# Patient Record
Sex: Male | Born: 1935 | Race: White | Hispanic: No | Smoking: Never smoker
Health system: Southern US, Community
[De-identification: ages and names within clinical notes are randomized; demographics above are authoritative.]

## PROBLEM LIST (undated history)

## (undated) DIAGNOSIS — I519 Heart disease, unspecified: Secondary | ICD-10-CM

## (undated) DIAGNOSIS — N289 Disorder of kidney and ureter, unspecified: Secondary | ICD-10-CM

## (undated) DIAGNOSIS — K219 Gastro-esophageal reflux disease without esophagitis: Secondary | ICD-10-CM

## (undated) DIAGNOSIS — I10 Essential (primary) hypertension: Secondary | ICD-10-CM

## (undated) HISTORY — PX: CORONARY ARTERY BYPASS GRAFT: SHX141

## (undated) HISTORY — PX: CHOLECYSTECTOMY: SHX55

---

## 2012-01-18 ENCOUNTER — Emergency Department (HOSPITAL_BASED_OUTPATIENT_CLINIC_OR_DEPARTMENT_OTHER)
Admission: EM | Admit: 2012-01-18 | Discharge: 2012-01-18 | Disposition: A | Discharge status: Home / Self Care | Payer: Medicare Other | Attending: Emergency Medicine | Admitting: Emergency Medicine

## 2012-01-18 ENCOUNTER — Encounter (HOSPITAL_BASED_OUTPATIENT_CLINIC_OR_DEPARTMENT_OTHER): Payer: Self-pay | Admitting: *Deleted

## 2012-01-18 DIAGNOSIS — N289 Disorder of kidney and ureter, unspecified: Secondary | ICD-10-CM | POA: Insufficient documentation

## 2012-01-18 DIAGNOSIS — I10 Essential (primary) hypertension: Secondary | ICD-10-CM | POA: Insufficient documentation

## 2012-01-18 DIAGNOSIS — T839XXA Unspecified complication of genitourinary prosthetic device, implant and graft, initial encounter: Secondary | ICD-10-CM

## 2012-01-18 DIAGNOSIS — T8389XA Other specified complication of genitourinary prosthetic devices, implants and grafts, initial encounter: Secondary | ICD-10-CM | POA: Insufficient documentation

## 2012-01-18 DIAGNOSIS — K219 Gastro-esophageal reflux disease without esophagitis: Secondary | ICD-10-CM | POA: Insufficient documentation

## 2012-01-18 DIAGNOSIS — N390 Urinary tract infection, site not specified: Secondary | ICD-10-CM

## 2012-01-18 DIAGNOSIS — Z951 Presence of aortocoronary bypass graft: Secondary | ICD-10-CM | POA: Insufficient documentation

## 2012-01-18 HISTORY — DX: Gastro-esophageal reflux disease without esophagitis: K21.9

## 2012-01-18 HISTORY — DX: Disorder of kidney and ureter, unspecified: N28.9

## 2012-01-18 HISTORY — DX: Heart disease, unspecified: I51.9

## 2012-01-18 HISTORY — DX: Essential (primary) hypertension: I10

## 2012-01-18 LAB — URINALYSIS, ROUTINE W REFLEX MICROSCOPIC
Bilirubin Urine: NEGATIVE
Glucose, UA: NEGATIVE mg/dL
Nitrite: POSITIVE — AB
Specific Gravity, Urine: 1.017 (ref 1.005–1.030)
pH: 6.5 (ref 5.0–8.0)

## 2012-01-18 LAB — URINE MICROSCOPIC-ADD ON

## 2012-01-18 MED ORDER — SULFAMETHOXAZOLE-TRIMETHOPRIM 800-160 MG PO TABS: 1.0000 | ORAL_TABLET | Freq: Two times a day (BID) | ORAL | Status: AC

## 2012-01-18 NOTE — ED Notes (Signed)
States his foley catheter is leaking. Foley was inserted 3 months ago for urinary retention.

## 2012-01-18 NOTE — ED Provider Notes (Signed)
History     CSN: [id number]  Arrival date & time 01/18/12  1744   First MD Initiated Contact with Patient 01/18/12 1918      No chief complaint on file.   (Consider location/radiation/quality/duration/timing/severity/associated sxs/prior treatment) Patient is a 78 y.o. male presenting with frequency. The history is provided by the patient. No language interpreter was used.  Urinary Frequency This is a new problem.  Pt complains of urine leaking from around foley.  Pt reports he started having leaking today  Past Medical History  Diagnosis Date  . Hypertension   . GERD (gastroesophageal reflux disease)   . Heart disease   . Renal disorder     Past Surgical History  Procedure Date  . Cholecystectomy   . Coronary artery bypass graft     No family history on file.  History  Substance Use Topics  . Smoking status: Never Smoker   . Smokeless tobacco: Not on file  . Alcohol Use: No      Review of Systems  Genitourinary: Positive for frequency.  All other systems reviewed and are negative.    Allergies  Review of patient's allergies indicates no known allergies.  Home Medications   Current Outpatient Rx  Name Route Sig Dispense Refill  . ASPIRIN 81 MG PO CHEW Oral Chew 81 mg by mouth daily.    Marland Kitchen CARVEDILOL 6.25 MG PO TABS Oral Take 6.25 mg by mouth 2 (two) times daily with a meal.    . IRON PO Oral Take 1 tablet by mouth 2 (two) times daily.    Marland Kitchen SIMVASTATIN 10 MG PO TABS Oral Take 10 mg by mouth daily.    . ERGOCALCIFEROL 50000 UNITS PO CAPS Oral Take 50,000 Units by mouth once a week.      BP 116/67  Pulse 70  Temp 97.4 F (36.3 C) (Oral)  Resp 22  SpO2 98%  Physical Exam  Nursing note and vitals reviewed. Constitutional: He appears well-developed and well-nourished.  HENT:  Head: Normocephalic and atraumatic.  Eyes: Conjunctivae are normal. Pupils are equal, round, and reactive to light.  Neck: Normal range of motion.  Cardiovascular: Normal  rate.   Pulmonary/Chest: Effort normal and breath sounds normal.  Abdominal: Soft. Bowel sounds are normal.  Genitourinary:       Foley no urine in bag,  Musculoskeletal: Normal range of motion.  Neurological: He is alert.  Skin: Skin is warm.  Psychiatric: He has a normal mood and affect.    ED Course  Procedures (including critical care time)   Labs Reviewed  URINALYSIS, ROUTINE W REFLEX MICROSCOPIC   No results found.   No diagnosis found.    MDM  Foley replaced 500cc urine output.       Lonia Skinner [place], [place] 01/18/12 2052

## 2012-01-18 NOTE — ED Provider Notes (Signed)
History/physical exam/procedure(s) were performed by non-physician practitioner and as supervising physician I was immediately available for consultation/collaboration. I have reviewed all notes and am in agreement with care and plan.   Hilario Quarry, MD 01/18/12 [phone]

## 2012-01-26 ENCOUNTER — Encounter (HOSPITAL_BASED_OUTPATIENT_CLINIC_OR_DEPARTMENT_OTHER): Payer: Self-pay | Admitting: *Deleted

## 2012-01-26 ENCOUNTER — Emergency Department (HOSPITAL_BASED_OUTPATIENT_CLINIC_OR_DEPARTMENT_OTHER)
Admission: EM | Admit: 2012-01-26 | Discharge: 2012-01-26 | Disposition: A | Discharge status: Home / Self Care | Payer: Medicare Other | Attending: Emergency Medicine | Admitting: Emergency Medicine

## 2012-01-26 DIAGNOSIS — I10 Essential (primary) hypertension: Secondary | ICD-10-CM | POA: Insufficient documentation

## 2012-01-26 DIAGNOSIS — I2510 Atherosclerotic heart disease of native coronary artery without angina pectoris: Secondary | ICD-10-CM | POA: Insufficient documentation

## 2012-01-26 DIAGNOSIS — K219 Gastro-esophageal reflux disease without esophagitis: Secondary | ICD-10-CM | POA: Insufficient documentation

## 2012-01-26 DIAGNOSIS — Z951 Presence of aortocoronary bypass graft: Secondary | ICD-10-CM | POA: Insufficient documentation

## 2012-01-26 DIAGNOSIS — N289 Disorder of kidney and ureter, unspecified: Secondary | ICD-10-CM | POA: Insufficient documentation

## 2012-01-26 DIAGNOSIS — Z9010 Acquired absence of unspecified breast and nipple: Secondary | ICD-10-CM | POA: Insufficient documentation

## 2012-01-26 DIAGNOSIS — IMO0002 Reserved for concepts with insufficient information to code with codable children: Secondary | ICD-10-CM | POA: Insufficient documentation

## 2012-01-26 DIAGNOSIS — S0990XA Unspecified injury of head, initial encounter: Secondary | ICD-10-CM | POA: Insufficient documentation

## 2012-01-26 NOTE — ED Provider Notes (Signed)
History     CSN: [id number]  Arrival date & time 01/26/12  2137   First MD Initiated Contact with Patient 01/26/12 2241      Chief Complaint  Patient presents with  . Fall  . Head Injury    HPI Comments: Pt was walking and tripped on the carpet hitting his head on the door. The patient lives with his son and his son heard a loud thud when this occurred. He was concerned and decided to bring his father in to be examined. The patient did not lose consciousness. He does not have a headache. He is not taking Coumadin or Plavix. He denies any neck pain. He's been able to ambulate without difficulty. He denies any nausea vomiting fever or other complaints  Patient is a 78 y.o. male presenting with fall and head injury. The history is provided by the patient.  Fall The accident occurred less than 1 hour ago. The fall occurred while walking.  Head Injury    the patient struck the back of his head on the door and Past Medical History  Diagnosis Date  . Hypertension   . GERD (gastroesophageal reflux disease)   . Heart disease   . Renal disorder     Past Surgical History  Procedure Date  . Cholecystectomy   . Coronary artery bypass graft     History reviewed. No pertinent family history.  History  Substance Use Topics  . Smoking status: Never Smoker   . Smokeless tobacco: Not on file  . Alcohol Use: No      Review of Systems  All other systems reviewed and are negative.    Allergies  Review of patient's allergies indicates no known allergies.  Home Medications   Current Outpatient Rx  Name Route Sig Dispense Refill  . ASPIRIN 81 MG PO CHEW Oral Chew 81 mg by mouth daily.    Marland Kitchen CARVEDILOL 6.25 MG PO TABS Oral Take 6.25 mg by mouth 2 (two) times daily with a meal.    . ERGOCALCIFEROL 50000 UNITS PO CAPS Oral Take 50,000 Units by mouth once a week.    . IRON PO Oral Take 1 tablet by mouth 2 (two) times daily.    Marland Kitchen SIMVASTATIN 10 MG PO TABS Oral Take 10 mg by mouth  daily.    . SULFAMETHOXAZOLE-TRIMETHOPRIM 800-160 MG PO TABS Oral Take 1 tablet by mouth every 12 (twelve) hours. 14 tablet 0    BP 118/67  Pulse 65  Temp 97.6 F (36.4 C) (Oral)  Ht 5\' 6"  (1.676 m)  Wt 176 lb (79.833 kg)  BMI 28.41 kg/m2  SpO2 98%  Physical Exam  Nursing note and vitals reviewed. Constitutional: He appears well-developed and well-nourished. No distress.  HENT:  Head: Normocephalic and atraumatic.  Right Ear: External ear normal.  Left Ear: External ear normal.       No tenderness palpation, no hematoma  Eyes: Conjunctivae are normal. Right eye exhibits no discharge. Left eye exhibits no discharge. No scleral icterus.  Neck: Neck supple. No tracheal deviation present.  Cardiovascular: Normal rate, regular rhythm and intact distal pulses.   Pulmonary/Chest: Effort normal and breath sounds normal. No stridor. No respiratory distress. He has no wheezes. He has no rales.  Abdominal: Soft. Bowel sounds are normal. He exhibits no distension. There is no tenderness. There is no rebound and no guarding.  Musculoskeletal: He exhibits no edema and no tenderness.       No cervical spine, thoracic or lumbar spine  tenderness  Neurological: He is alert. He has normal strength. No sensory deficit. Cranial nerve deficit:  no gross defecits noted. He exhibits normal muscle tone. He displays no seizure activity. Coordination normal.  Skin: Skin is warm and dry. No rash noted.  Psychiatric: He has a normal mood and affect.    ED Course  Procedures (including critical care time)  Labs Reviewed - No data to display No results found.   1. Minor head injury       MDM  The patient had a minor head injury. He did not lose consciousness. He is not on any anticoagulants. There are no external findings on exam at this time. He did not feel that CT scan imaging is necessary. I discussed precautions with the patient and his son and they're comfortable with going home and they will  monitor for any signs of headache, confusion or worsening symptoms       Celene Kras, MD 01/26/12 [phone]

## 2012-01-26 NOTE — ED Notes (Signed)
Pt c/o fall x 1 hr ago landing on carpet hitting head on door.

## 2012-02-03 ENCOUNTER — Emergency Department (HOSPITAL_BASED_OUTPATIENT_CLINIC_OR_DEPARTMENT_OTHER)
Admission: EM | Admit: 2012-02-03 | Discharge: 2012-02-03 | Disposition: A | Discharge status: Home / Self Care | Payer: Medicare Other | Attending: Emergency Medicine | Admitting: Emergency Medicine

## 2012-02-03 ENCOUNTER — Emergency Department (HOSPITAL_BASED_OUTPATIENT_CLINIC_OR_DEPARTMENT_OTHER): Payer: Medicare Other

## 2012-02-03 ENCOUNTER — Encounter (HOSPITAL_BASED_OUTPATIENT_CLINIC_OR_DEPARTMENT_OTHER): Payer: Self-pay | Admitting: *Deleted

## 2012-02-03 DIAGNOSIS — I10 Essential (primary) hypertension: Secondary | ICD-10-CM | POA: Insufficient documentation

## 2012-02-03 DIAGNOSIS — K219 Gastro-esophageal reflux disease without esophagitis: Secondary | ICD-10-CM | POA: Insufficient documentation

## 2012-02-03 DIAGNOSIS — T18108A Unspecified foreign body in esophagus causing other injury, initial encounter: Secondary | ICD-10-CM

## 2012-02-03 DIAGNOSIS — I2510 Atherosclerotic heart disease of native coronary artery without angina pectoris: Secondary | ICD-10-CM | POA: Insufficient documentation

## 2012-02-03 DIAGNOSIS — Z951 Presence of aortocoronary bypass graft: Secondary | ICD-10-CM | POA: Insufficient documentation

## 2012-02-03 DIAGNOSIS — N289 Disorder of kidney and ureter, unspecified: Secondary | ICD-10-CM | POA: Insufficient documentation

## 2012-02-03 DIAGNOSIS — IMO0002 Reserved for concepts with insufficient information to code with codable children: Secondary | ICD-10-CM | POA: Insufficient documentation

## 2012-02-03 NOTE — ED Provider Notes (Signed)
History     CSN: [id number]  Arrival date & time 02/03/12  2841   First MD Initiated Contact with Patient 02/03/12 1920      Chief Complaint  Patient presents with  . Dysphagia    (Consider location/radiation/quality/duration/timing/severity/associated sxs/prior treatment) HPI Comments: Son states that the pt was eating fried okra, mashed potatoes and black eyed peas and started choking:pt was c/o feeling like something was stuck in his throat:pt states that he is feeling better at this this time:pt denies sob or problems tolerating saliva  The history is provided by the patient. No language interpreter was used.    Past Medical History  Diagnosis Date  . Hypertension   . GERD (gastroesophageal reflux disease)   . Heart disease   . Renal disorder     Past Surgical History  Procedure Date  . Cholecystectomy   . Coronary artery bypass graft     History reviewed. No pertinent family history.  History  Substance Use Topics  . Smoking status: Never Smoker   . Smokeless tobacco: Not on file  . Alcohol Use: No      Review of Systems  Respiratory: Negative.   Cardiovascular: Negative.   Neurological: Negative.     Allergies  Review of patient's allergies indicates no known allergies.  Home Medications   Current Outpatient Rx  Name Route Sig Dispense Refill  . ASPIRIN 81 MG PO CHEW Oral Chew 81 mg by mouth daily.    Marland Kitchen CARVEDILOL 6.25 MG PO TABS Oral Take 6.25 mg by mouth 2 (two) times daily with a meal.    . FUROSEMIDE 20 MG PO TABS Oral Take 20 mg by mouth 2 (two) times daily.    . IRON PO Oral Take 1 tablet by mouth 2 (two) times daily.    Marland Kitchen SIMVASTATIN 10 MG PO TABS Oral Take 10 mg by mouth daily.    . ERGOCALCIFEROL 50000 UNITS PO CAPS Oral Take 50,000 Units by mouth once a week.      BP 127/66  Pulse 68  Temp 98 F (36.7 C) (Oral)  Resp 20  Ht 5\' 6"  (1.676 m)  Wt 160 lb (72.576 kg)  BMI 25.82 kg/m2  SpO2 96%  Physical Exam  Nursing note and  vitals reviewed. Constitutional: He appears well-developed and well-nourished.  HENT:  Head: Normocephalic and atraumatic.  Mouth/Throat: Oropharynx is clear and moist.  Cardiovascular: Normal rate and regular rhythm.   Pulmonary/Chest: Effort normal and breath sounds normal.  Musculoskeletal: Normal range of motion.  Neurological: He is alert. He exhibits normal muscle tone. Coordination normal.  Skin: Skin is warm and dry.    ED Course  Procedures (including critical care time)  Labs Reviewed - No data to display Dg Chest 2 View  02/03/2012  *RADIOLOGY REPORT*  Clinical Data: Shortness of breath and trouble swallowing.  CHEST - 2 VIEW  Comparison: None  Findings: Cardiomegaly is identified with evidence of previous CABG. Small bilateral pleural effusions are noted with bibasilar atelectasis. There is no evidence of airspace disease, pulmonary edema or pneumothorax. No acute bony abnormalities are identified.  IMPRESSION: Cardiomegaly with small bilateral pleural effusions and bibasilar atelectasis.  Original Report Authenticated By: Rosendo Gros, M.D.     1. Foreign body in esophagus       MDM  Pt feeling better:pt is tolerating po without any problem        Teressa Lower, NP 02/03/12 2124

## 2012-02-03 NOTE — ED Notes (Signed)
Pt was eating and started having trouble swallowing. No distress at present. Spitting.

## 2012-02-03 NOTE — ED Provider Notes (Signed)
History/physical exam/procedure(s) were performed by non-physician practitioner and as supervising physician I was immediately available for consultation/collaboration. I have reviewed all notes and am in agreement with care and plan.   Hilario Quarry, MD 02/03/12 [phone]

## 2012-02-03 NOTE — ED Notes (Signed)
Patient transported to X-ray 

## 2012-02-03 NOTE — ED Notes (Signed)
Pt drinking juice per EDNP order- tolerating well

## 2012-02-24 ENCOUNTER — Encounter (HOSPITAL_BASED_OUTPATIENT_CLINIC_OR_DEPARTMENT_OTHER): Payer: Self-pay | Admitting: *Deleted

## 2012-02-24 ENCOUNTER — Emergency Department (HOSPITAL_BASED_OUTPATIENT_CLINIC_OR_DEPARTMENT_OTHER)
Admission: EM | Admit: 2012-02-24 | Discharge: 2012-02-24 | Disposition: A | Discharge status: Home / Self Care | Payer: Medicare Other | Attending: Emergency Medicine | Admitting: Emergency Medicine

## 2012-02-24 DIAGNOSIS — K219 Gastro-esophageal reflux disease without esophagitis: Secondary | ICD-10-CM | POA: Insufficient documentation

## 2012-02-24 DIAGNOSIS — N289 Disorder of kidney and ureter, unspecified: Secondary | ICD-10-CM | POA: Insufficient documentation

## 2012-02-24 DIAGNOSIS — Z9861 Coronary angioplasty status: Secondary | ICD-10-CM | POA: Insufficient documentation

## 2012-02-24 DIAGNOSIS — T83091A Other mechanical complication of indwelling urethral catheter, initial encounter: Secondary | ICD-10-CM | POA: Insufficient documentation

## 2012-02-24 DIAGNOSIS — T839XXA Unspecified complication of genitourinary prosthetic device, implant and graft, initial encounter: Secondary | ICD-10-CM

## 2012-02-24 DIAGNOSIS — Y846 Urinary catheterization as the cause of abnormal reaction of the patient, or of later complication, without mention of misadventure at the time of the procedure: Secondary | ICD-10-CM | POA: Insufficient documentation

## 2012-02-24 DIAGNOSIS — I519 Heart disease, unspecified: Secondary | ICD-10-CM | POA: Insufficient documentation

## 2012-02-24 DIAGNOSIS — I10 Essential (primary) hypertension: Secondary | ICD-10-CM | POA: Insufficient documentation

## 2012-02-24 DIAGNOSIS — Z79899 Other long term (current) drug therapy: Secondary | ICD-10-CM | POA: Insufficient documentation

## 2012-02-24 DIAGNOSIS — Z7982 Long term (current) use of aspirin: Secondary | ICD-10-CM | POA: Insufficient documentation

## 2012-02-24 NOTE — ED Provider Notes (Addendum)
History     CSN: [id number]  Arrival date & time 02/24/12  1258   First MD Initiated Contact with Patient 02/24/12 1308      Chief Complaint  Patient presents with  . Urinary Incontinence    (Consider location/radiation/quality/duration/timing/severity/associated sxs/prior treatment) HPI Comments: Patient is here due to the Foley catheter leaking this morning. He has had a chronic indwelling Foley that was replaced on 01/18/2012. Presents today do to leakage around it. He denies any abdominal pain, fever, confusion or nausea. States he saw the urologist yesterday and everything was normal.  The history is provided by the patient and a relative.    Past Medical History  Diagnosis Date  . Hypertension   . GERD (gastroesophageal reflux disease)   . Heart disease   . Renal disorder     Past Surgical History  Procedure Date  . Cholecystectomy   . Coronary artery bypass graft     History reviewed. No pertinent family history.  History  Substance Use Topics  . Smoking status: Never Smoker   . Smokeless tobacco: Not on file  . Alcohol Use: No      Review of Systems  Constitutional: Negative for fever.  Respiratory: Negative for cough and shortness of breath.   Gastrointestinal: Negative for nausea, vomiting and abdominal pain.  Genitourinary: Negative for dysuria and flank pain.  All other systems reviewed and are negative.    Allergies  Review of patient's allergies indicates no known allergies.  Home Medications   Current Outpatient Rx  Name Route Sig Dispense Refill  . ASPIRIN 81 MG PO CHEW Oral Chew 81 mg by mouth daily.    Marland Kitchen CARVEDILOL 6.25 MG PO TABS Oral Take 6.25 mg by mouth 2 (two) times daily with a meal.    . ERGOCALCIFEROL 50000 UNITS PO CAPS Oral Take 50,000 Units by mouth once a week.    . FUROSEMIDE 20 MG PO TABS Oral Take 20 mg by mouth 2 (two) times daily.    . IRON PO Oral Take 1 tablet by mouth 2 (two) times daily.    Marland Kitchen SIMVASTATIN 10 MG PO  TABS Oral Take 10 mg by mouth daily.      BP 122/64  Pulse 69  Temp 97.6 F (36.4 C) (Oral)  Resp 16  Ht 5\' 6"  (1.676 m)  Wt 165 lb (74.844 kg)  BMI 26.63 kg/m2  SpO2 99%  Physical Exam  Nursing note and vitals reviewed. Constitutional: He is oriented to person, place, and time. He appears well-developed and well-nourished. No distress.  HENT:  Head: Normocephalic and atraumatic.  Mouth/Throat: Oropharynx is clear and moist.  Eyes: EOM are normal. Pupils are equal, round, and reactive to light.  Abdominal: Soft. Bowel sounds are normal. He exhibits no distension. There is no tenderness.  Genitourinary:       Foley catheter presents with urine and the bag. Tried urine on the patient's clothing. No current leakage  Musculoskeletal: Normal range of motion. He exhibits no edema and no tenderness.  Neurological: He is alert and oriented to person, place, and time.  Skin: Skin is warm and dry.  Psychiatric: He has a normal mood and affect. His behavior is normal.    ED Course  Procedures (including critical care time)   Labs Reviewed  URINE CULTURE   No results found.   1. Foley catheter problem       MDM   Patient with a chronic Foley that was last changed almost 2 months  ago who presents do to the catheter leaking this morning. Patient denies any symptoms concerning for a UTI such as fever, confusion, abdominal pain. Will replace Foley catheter and send a urine culture. Patient sees the urologist regularly and will followup with them. Will return for any worsening symptoms.        Gwyneth Sprout, MD 02/24/12 1318  Gwyneth Sprout, MD 02/24/12 1319

## 2012-02-24 NOTE — ED Notes (Signed)
Pt has a foley catheter that started leaking this morning. Has been seen here for same.

## 2012-02-27 LAB — URINE CULTURE: Colony Count: 40000

## 2012-02-28 NOTE — ED Notes (Signed)
+   Urine Chart sent to EDP office for review. 

## 2012-03-28 ENCOUNTER — Emergency Department (HOSPITAL_BASED_OUTPATIENT_CLINIC_OR_DEPARTMENT_OTHER)
Admission: EM | Admit: 2012-03-28 | Discharge: 2012-03-28 | Disposition: A | Discharge status: Home / Self Care | Payer: Medicare Other | Attending: Emergency Medicine | Admitting: Emergency Medicine

## 2012-03-28 ENCOUNTER — Encounter (HOSPITAL_BASED_OUTPATIENT_CLINIC_OR_DEPARTMENT_OTHER): Payer: Self-pay | Admitting: *Deleted

## 2012-03-28 DIAGNOSIS — T83091A Other mechanical complication of indwelling urethral catheter, initial encounter: Secondary | ICD-10-CM | POA: Insufficient documentation

## 2012-03-28 DIAGNOSIS — K219 Gastro-esophageal reflux disease without esophagitis: Secondary | ICD-10-CM | POA: Insufficient documentation

## 2012-03-28 DIAGNOSIS — Z7982 Long term (current) use of aspirin: Secondary | ICD-10-CM | POA: Insufficient documentation

## 2012-03-28 DIAGNOSIS — I519 Heart disease, unspecified: Secondary | ICD-10-CM | POA: Insufficient documentation

## 2012-03-28 DIAGNOSIS — N289 Disorder of kidney and ureter, unspecified: Secondary | ICD-10-CM | POA: Insufficient documentation

## 2012-03-28 DIAGNOSIS — Y846 Urinary catheterization as the cause of abnormal reaction of the patient, or of later complication, without mention of misadventure at the time of the procedure: Secondary | ICD-10-CM | POA: Insufficient documentation

## 2012-03-28 DIAGNOSIS — I10 Essential (primary) hypertension: Secondary | ICD-10-CM | POA: Insufficient documentation

## 2012-03-28 DIAGNOSIS — T839XXA Unspecified complication of genitourinary prosthetic device, implant and graft, initial encounter: Secondary | ICD-10-CM

## 2012-03-28 DIAGNOSIS — N390 Urinary tract infection, site not specified: Secondary | ICD-10-CM | POA: Insufficient documentation

## 2012-03-28 DIAGNOSIS — Z79899 Other long term (current) drug therapy: Secondary | ICD-10-CM | POA: Insufficient documentation

## 2012-03-28 DIAGNOSIS — N138 Other obstructive and reflux uropathy: Secondary | ICD-10-CM | POA: Insufficient documentation

## 2012-03-28 DIAGNOSIS — R338 Other retention of urine: Secondary | ICD-10-CM | POA: Insufficient documentation

## 2012-03-28 DIAGNOSIS — N401 Enlarged prostate with lower urinary tract symptoms: Secondary | ICD-10-CM | POA: Insufficient documentation

## 2012-03-28 LAB — URINALYSIS, ROUTINE W REFLEX MICROSCOPIC
Nitrite: NEGATIVE
Specific Gravity, Urine: 1.012 (ref 1.005–1.030)
Urobilinogen, UA: 0.2 mg/dL (ref 0.0–1.0)

## 2012-03-28 LAB — URINE MICROSCOPIC-ADD ON

## 2012-03-28 MED ORDER — NITROFURANTOIN MONOHYD MACRO 100 MG PO CAPS: 100.0000 mg | ORAL_CAPSULE | Freq: Two times a day (BID) | ORAL | Status: DC

## 2012-03-28 NOTE — ED Notes (Signed)
Woke up this am noted his foley catheter had been leaking no other symptoms

## 2012-03-28 NOTE — ED Provider Notes (Signed)
History     CSN: [id number]  Arrival date & time 03/28/12  0709   First MD Initiated Contact with Patient 03/28/12 [phone]      Chief Complaint  Patient presents with  . foley cath leaking     (Consider location/radiation/quality/duration/timing/severity/associated sxs/prior treatment) HPIHarold Gallagher is a 78 y.o. male with a history of prostatic hyperplasia causing urinary outlet obstruction, who had a Foley placed a few months ago he has had it replaced a few times since then because it has been blocked and leaking. The patient woke up this morning and noticed that his sheets are wet and noted that the Foley catheter was also leaking. His son lives with him and empties the catheter leg bag about 2-3 times daily. Patient has been treated for urinary tract infection recently, denies any fevers, chills, lightheadedness, nausea vomiting or diarrhea.  Patient's son does note some mild confusion occasionally yesterday.    Past Medical History  Diagnosis Date  . Hypertension   . GERD (gastroesophageal reflux disease)   . Heart disease   . Renal disorder     Past Surgical History  Procedure Date  . Cholecystectomy   . Coronary artery bypass graft     History reviewed. No pertinent family history.  History  Substance Use Topics  . Smoking status: Never Smoker   . Smokeless tobacco: Not on file  . Alcohol Use: No      Review of Systems At least 10pt or greater review of systems completed and are negative except where specified in the HPI.  Allergies  Review of patient's allergies indicates no known allergies.  Home Medications   Current Outpatient Rx  Name Route Sig Dispense Refill  . ASPIRIN 81 MG PO CHEW Oral Chew 81 mg by mouth daily.    Marland Kitchen CARVEDILOL 6.25 MG PO TABS Oral Take 6.25 mg by mouth 2 (two) times daily with a meal.    . ERGOCALCIFEROL 50000 UNITS PO CAPS Oral Take 50,000 Units by mouth once a week.    . FUROSEMIDE 20 MG PO TABS Oral Take 20 mg by mouth 2  (two) times daily.    . IRON PO Oral Take 1 tablet by mouth 2 (two) times daily.    Marland Kitchen SIMVASTATIN 10 MG PO TABS Oral Take 10 mg by mouth daily.      BP 118/67  Pulse 64  Temp 97.8 F (36.6 C) (Oral)  Resp 16  Ht 5\' 6"  (1.676 m)  Wt 170 lb (77.111 kg)  BMI 27.44 kg/m2  SpO2 100%  Physical Exam  Nursing notes reviewed.  Electronic medical record reviewed. VITAL SIGNS:   Filed Vitals:   03/28/12 0727  BP: 118/67  Pulse: 64  Temp: 97.8 F (36.6 C)  TempSrc: Oral  Resp: 16  Height: 5\' 6"  (1.676 m)  Weight: 170 lb (77.111 kg)  SpO2: 100%   CONSTITUTIONAL: Awake, oriented, appears non-toxic HENT: Atraumatic, normocephalic, oral mucosa pink and moist, airway patent. Nares patent without drainage. External ears normal. EYES: Conjunctiva clear, EOMI, PERRLA NECK: Trachea midline, non-tender, supple CARDIOVASCULAR: Normal heart rate, Normal rhythm, No murmurs, rubs, gallops PULMONARY/CHEST: Clear to auscultation, no rhonchi, wheezes, or rales. Symmetrical breath sounds. Non-tender. ABDOMINAL: Mildly distended lower abdomen, mildly tender to palpation-this feels like a pressure, otherwise soft, non-tender - no rebound or guarding.  BS normal. NEUROLOGIC: Non-focal, moving all four extremities, no gross sensory or motor deficits. EXTREMITIES: No clubbing, cyanosis, or edema SKIN: Warm, Dry, No erythema, No rash  ED  Course  [place] bedside Performed by: Corey Gallagher Authorized by: Corey Gallagher Consent: Verbal consent obtained. Patient identity confirmed: verbally with patient Local anesthesia used: no Patient sedated: no Patient tolerance: Patient tolerated the procedure well with no immediate complications. Comments: Patient's lower abdomen was ultrasounded at the bedside showing an extremely dilated and distended bladder it extended up to the patient's inferior umbilicus. Urine bag at the bedside is empty    (including critical care time)  Labs Reviewed  URINALYSIS,  ROUTINE W REFLEX MICROSCOPIC - Abnormal; Notable for the following:    APPearance TURBID (*)     Hgb urine dipstick MODERATE (*)     Protein, ur 30 (*)     Leukocytes, UA LARGE (*)     All other components within normal limits  URINE MICROSCOPIC-ADD ON - Abnormal; Notable for the following:    Bacteria, UA FEW (*)     All other components within normal limits   No results found.   1. Foley catheter problem   2. Enlarged prostate with urinary retention   3. UTI (urinary tract infection)      Medications  nitrofurantoin, macrocrystal-monohydrate, (MACROBID) 100 MG capsule (not administered)     MDM  Corey Gallagher is a 78 y.o. male presenting with failed Foley catheter. Will replace Foley and obtain urine sample.   nurse replace Foley catheter and urinalysis shows turbid urine. Urinalysis is consistent with colonization-easy symptomatic with the exception of the mildly confused yesterday, this is concerning that the patient has become symptomatic because of his blocked Foley catheter. We'll elect to the patient with Macrobid as a review of his prior cultures had shown a coag negative staph sensitive to Macrobid in the past.  Patient has followup with urology and with his primary care physician.  Discussed returning to the emergency department with patient and his son. I explained the diagnosis and have given explicit precautions to return to the ER including fevers, chills, nausea, vomiting or worsening confusion or changes in mental status or any other new or worsening symptoms. The patient understands and accepts the medical plan as it's been dictated and I have answered their questions. Discharge instructions concerning home care and prescriptions have been given.  The patient is STABLE and is discharged to home in good condition.          Corey Skene, MD 03/30/12 1502

## 2012-03-28 NOTE — ED Notes (Signed)
#  16 foley catheter inserted by Michaelle Copas, RN per protocol. Immediate return of 400cc creamy, cloudy whitish colored urine. Connected to s/d, samples sent to lab for testing.

## 2012-03-28 NOTE — ED Notes (Signed)
Pt states that his catheter is leaking from the tube. No leakage at the moment. Denies pain or burning. States that it is emptied 2-3 times a day by son.

## 2012-03-30 LAB — URINE CULTURE: Colony Count: 75000

## 2012-03-31 NOTE — ED Notes (Signed)
+  Urine. Patient given Macrobid. No sensitivities listed. Chart sent to EDP office for review. °

## 2012-04-04 NOTE — ED Notes (Signed)
Rx for Fluconazole 150 mg po 1 x per [facility]

## 2012-04-05 ENCOUNTER — Telehealth (HOSPITAL_COMMUNITY): Payer: Self-pay | Admitting: *Deleted

## 2012-04-06 ENCOUNTER — Telehealth (HOSPITAL_COMMUNITY): Payer: Self-pay | Admitting: Emergency Medicine

## 2012-04-07 ENCOUNTER — Telehealth (HOSPITAL_COMMUNITY): Payer: Self-pay | Admitting: Emergency Medicine

## 2012-04-07 NOTE — ED Notes (Signed)
Called patient and patient requested we talk to Corey Gallagher. Spoke with Corey Gallagher and informed him of new Rx. Rx called to [facility] by Norm Parcel PFM.

## 2012-04-16 ENCOUNTER — Encounter (HOSPITAL_BASED_OUTPATIENT_CLINIC_OR_DEPARTMENT_OTHER): Payer: Self-pay | Admitting: *Deleted

## 2012-04-16 ENCOUNTER — Emergency Department (HOSPITAL_BASED_OUTPATIENT_CLINIC_OR_DEPARTMENT_OTHER)
Admission: EM | Admit: 2012-04-16 | Discharge: 2012-04-16 | Disposition: A | Discharge status: Home / Self Care | Payer: Medicare Other | Attending: Emergency Medicine | Admitting: Emergency Medicine

## 2012-04-16 DIAGNOSIS — R300 Dysuria: Secondary | ICD-10-CM | POA: Insufficient documentation

## 2012-04-16 DIAGNOSIS — Z951 Presence of aortocoronary bypass graft: Secondary | ICD-10-CM | POA: Insufficient documentation

## 2012-04-16 DIAGNOSIS — K219 Gastro-esophageal reflux disease without esophagitis: Secondary | ICD-10-CM | POA: Insufficient documentation

## 2012-04-16 DIAGNOSIS — I10 Essential (primary) hypertension: Secondary | ICD-10-CM | POA: Insufficient documentation

## 2012-04-16 DIAGNOSIS — Z87440 Personal history of urinary (tract) infections: Secondary | ICD-10-CM | POA: Insufficient documentation

## 2012-04-16 MED ORDER — CIPROFLOXACIN HCL 500 MG PO TABS: 500.0000 mg | ORAL_TABLET | Freq: Two times a day (BID) | ORAL | Status: AC

## 2012-04-16 MED ORDER — NITROFURANTOIN MONOHYD MACRO 100 MG PO CAPS: 100.0000 mg | ORAL_CAPSULE | Freq: Two times a day (BID) | ORAL | Status: AC

## 2012-04-16 NOTE — ED Provider Notes (Signed)
History     CSN: [id number]  Arrival date & time 04/16/12  2016   First MD Initiated Contact with Patient 04/16/12 2033      Chief Complaint  Patient presents with  . Abdominal Pain     HPI Patient has feeling of dysuria and urgency.  Has indwelling Foley first last several months.  History of UTIs in the past.  Denies fever.  Occasional abdominal pain. Past Medical History  Diagnosis Date  . Hypertension   . GERD (gastroesophageal reflux disease)   . Heart disease   . Renal disorder     Past Surgical History  Procedure Date  . Cholecystectomy   . Coronary artery bypass graft     No family history on file.  History  Substance Use Topics  . Smoking status: Never Smoker   . Smokeless tobacco: Not on file  . Alcohol Use: No      Review of Systems  All other systems reviewed and are negative.    Allergies  Review of patient's allergies indicates no known allergies.  Home Medications   Current Outpatient Rx  Name Route Sig Dispense Refill  . ASPIRIN 81 MG PO CHEW Oral Chew 81 mg by mouth daily.    Marland Kitchen CARVEDILOL 6.25 MG PO TABS Oral Take 6.25 mg by mouth 2 (two) times daily with a meal.    . ERGOCALCIFEROL 50000 UNITS PO CAPS Oral Take 50,000 Units by mouth once a week.    . FUROSEMIDE 20 MG PO TABS Oral Take 20 mg by mouth 2 (two) times daily.    . IRON PO Oral Take 1 tablet by mouth 2 (two) times daily.    Marland Kitchen NITROFURANTOIN MONOHYD MACRO 100 MG PO CAPS Oral Take 1 capsule (100 mg total) by mouth 2 (two) times daily. 10 capsule 0  . NITROFURANTOIN MONOHYD MACRO 100 MG PO CAPS Oral Take 1 capsule (100 mg total) by mouth 2 (two) times daily. 10 capsule 0  . SIMVASTATIN 10 MG PO TABS Oral Take 10 mg by mouth daily.      BP 115/66  Pulse 72  Temp 98.1 F (36.7 C) (Oral)  Resp 22  Ht 5\' 6"  (1.676 m)  Wt 165 lb (74.844 kg)  BMI 26.63 kg/m2  SpO2 99%  Physical Exam  Nursing note and vitals reviewed. Constitutional: He is oriented to person, place, and  time. He appears well-developed and well-nourished. No distress.  HENT:  Head: Normocephalic and atraumatic.  Eyes: Pupils are equal, round, and reactive to light.  Neck: Normal range of motion.  Cardiovascular: Normal rate and intact distal pulses.   Pulmonary/Chest: No respiratory distress.  Abdominal: Normal appearance. He exhibits no distension. There is no tenderness. There is no rebound.  Genitourinary:       Foley catheter flushed.  Evaluation with ultrasound showed bladder empty.  The balloon in place.  Musculoskeletal: Normal range of motion.  Neurological: He is alert and oriented to person, place, and time. No cranial nerve deficit.  Skin: Skin is warm and dry. No rash noted.  Psychiatric: He has a normal mood and affect. His behavior is normal.    ED Course  Procedures (including critical care time)  Labs Reviewed - No data to display No results found.   1. Dysuria       MDM          Nelia Shi, MD 04/16/12 2108

## 2012-04-16 NOTE — ED Notes (Signed)
Abdominal pain. Has had a urinary catheter since January. It was changed here a month ago.

## 2012-04-16 NOTE — ED Notes (Signed)
Foley cath irrigated without difficulty, clear yellow urine returned, pt tolerated well, new leg bag applied

## 2014-03-12 ENCOUNTER — Emergency Department (HOSPITAL_BASED_OUTPATIENT_CLINIC_OR_DEPARTMENT_OTHER)
Admission: EM | Admit: 2014-03-12 | Discharge: 2014-03-12 | Disposition: A | Discharge status: Home / Self Care | Payer: Medicare Other | Attending: Emergency Medicine | Admitting: Emergency Medicine

## 2014-03-12 ENCOUNTER — Encounter (HOSPITAL_BASED_OUTPATIENT_CLINIC_OR_DEPARTMENT_OTHER): Payer: Self-pay | Admitting: Emergency Medicine

## 2014-03-12 ENCOUNTER — Emergency Department (HOSPITAL_BASED_OUTPATIENT_CLINIC_OR_DEPARTMENT_OTHER): Payer: Medicare Other

## 2014-03-12 DIAGNOSIS — S2249XA Multiple fractures of ribs, unspecified side, initial encounter for closed fracture: Secondary | ICD-10-CM | POA: Insufficient documentation

## 2014-03-12 DIAGNOSIS — Z79899 Other long term (current) drug therapy: Secondary | ICD-10-CM | POA: Insufficient documentation

## 2014-03-12 DIAGNOSIS — I10 Essential (primary) hypertension: Secondary | ICD-10-CM | POA: Insufficient documentation

## 2014-03-12 DIAGNOSIS — Z951 Presence of aortocoronary bypass graft: Secondary | ICD-10-CM | POA: Diagnosis not present

## 2014-03-12 DIAGNOSIS — Z87448 Personal history of other diseases of urinary system: Secondary | ICD-10-CM | POA: Diagnosis not present

## 2014-03-12 DIAGNOSIS — Z792 Long term (current) use of antibiotics: Secondary | ICD-10-CM | POA: Insufficient documentation

## 2014-03-12 DIAGNOSIS — Y929 Unspecified place or not applicable: Secondary | ICD-10-CM | POA: Diagnosis not present

## 2014-03-12 DIAGNOSIS — S298XXA Other specified injuries of thorax, initial encounter: Secondary | ICD-10-CM | POA: Diagnosis present

## 2014-03-12 DIAGNOSIS — S2241XA Multiple fractures of ribs, right side, initial encounter for closed fracture: Secondary | ICD-10-CM

## 2014-03-12 DIAGNOSIS — Z7982 Long term (current) use of aspirin: Secondary | ICD-10-CM | POA: Diagnosis not present

## 2014-03-12 DIAGNOSIS — Y9389 Activity, other specified: Secondary | ICD-10-CM | POA: Insufficient documentation

## 2014-03-12 DIAGNOSIS — R296 Repeated falls: Secondary | ICD-10-CM | POA: Diagnosis not present

## 2014-03-12 DIAGNOSIS — Z8719 Personal history of other diseases of the digestive system: Secondary | ICD-10-CM | POA: Insufficient documentation

## 2014-03-12 MED ORDER — HYDROCODONE-ACETAMINOPHEN 5-325 MG PO TABS: 0.5000 | ORAL_TABLET | ORAL | Status: DC | PRN

## 2014-03-12 MED ORDER — HYDROCODONE-ACETAMINOPHEN 5-325 MG PO TABS
0.5000 | ORAL_TABLET | Freq: Once | ORAL | Status: AC
  Administered 2014-03-12: 0.5 via ORAL
  Filled 2014-03-12: qty 1

## 2014-03-12 NOTE — ED Notes (Signed)
Lost his balance and fell backward. Pain in his right ribs.

## 2014-03-12 NOTE — Discharge Instructions (Signed)

## 2014-03-12 NOTE — ED Provider Notes (Signed)
CSN: [id number]     Arrival date & time 03/12/14  2020 History   None    This chart was scribed for Corey Seamen, MD by Arlan Organ, ED Scribe. This patient was seen in room MH09/MH09 and the patient's care was started 11:49 PM.   Chief Complaint  Patient presents with  . Fall   Patient is a 80 y.o. male presenting with fall.  Fall    HPI Comments: Corey Gallagher is a 80 y.o. male, who normally ambulates with a walker, complaining of a fall that occurred around 3:30 PM this afternoon. Pt states he got turned around backwards and fell into a table. He now c/o pain to the lower back and R lower ribs. Pain is minimal with normal respirations, but exacerbated with coughing and sneezing which causes it to be transiently severe. He denies neck or spinal injury or associated symptoms. His ambulation and ability to stand and sit have not been compromised.  Past Medical History  Diagnosis Date  . Hypertension   . GERD (gastroesophageal reflux disease)   . Heart disease   . Renal disorder    Past Surgical History  Procedure Laterality Date  . Cholecystectomy    . Coronary artery bypass graft     No family history on file. History  Substance Use Topics  . Smoking status: Never Smoker   . Smokeless tobacco: Not on file  . Alcohol Use: No    Review of Systems  All other systems reviewed and are negative.  Allergies  Review of patient's allergies indicates no known allergies.  Home Medications   Prior to Admission medications   Medication Sig Start Date End Date Taking? Authorizing Provider  aspirin 81 MG chewable tablet Chew 81 mg by mouth daily.    Historical Provider, MD  carvedilol (COREG) 6.25 MG tablet Take 6.25 mg by mouth 2 (two) times daily with a meal.    Historical Provider, MD  ciprofloxacin (CIPRO) 500 MG tablet Take 1 tablet (500 mg total) by mouth every 12 (twelve) hours. 04/16/12   Nelia Shi, MD  ergocalciferol (VITAMIN D2) 50000 UNITS capsule Take 50,000  Units by mouth once a week.    Historical Provider, MD  furosemide (LASIX) 20 MG tablet Take 20 mg by mouth 2 (two) times daily.    Historical Provider, MD  HYDROcodone-acetaminophen (NORCO) 5-325 MG per tablet Take 0.5-1 tablets by mouth every 4 (four) hours as needed (for pain; may cause constipation). 03/12/14   Stephan Nelis L Mickelle Goupil, MD  IRON PO Take 1 tablet by mouth 2 (two) times daily.    Historical Provider, MD  nitrofurantoin, macrocrystal-monohydrate, (MACROBID) 100 MG capsule Take 1 capsule (100 mg total) by mouth 2 (two) times daily. 04/16/12   Nelia Shi, MD  simvastatin (ZOCOR) 10 MG tablet Take 10 mg by mouth daily.    Historical Provider, MD   Triage Vitals: BP 159/81  Pulse 71  Temp(Src) 98.2 F (36.8 C) (Oral)  Resp 20  Wt 165 lb (74.844 kg)  SpO2 100%   Physical Exam  General: Well-developed, well-nourished male in no acute distress; appearance consistent with age of record HENT: normocephalic; atraumatic Eyes: pupils equal, round and reactive to light; extraocular muscles intact Neck: supple; nontender Heart: regular rate and rhythm; no murmurs, rubs or gallops Lungs: clear to auscultation bilaterally Abdomen: soft; nondistended; nontender; no masses or hepatosplenomegaly; bowel sounds present Extremities: No deformity; full range of motion; pulses normal; +1 edema of lower legs Neurologic: Awake,  alert and oriented; motor function intact in all extremities and symmetric; no facial droop Skin: Warm and dry Psychiatric: Normal mood and affect  Back: tenderness to R lower back and inferior R posterior ribs without deformity or crepitus palpated; no spinal tenderness  ED Course  Procedures (including critical care time)  DIAGNOSTIC STUDIES: Oxygen Saturation is 100% on RA, Normal by my interpretation.    COORDINATION OF CARE: 11:49 PM- Will order Discussed treatment plan with pt at bedside and pt agreed to plan.    MDM  Nursing notes and vitals signs, including  pulse oximetry, reviewed.  Summary of this visit's results, reviewed by myself:  Imaging Studies: Dg Ribs Unilateral W/chest Right  03/12/2014   CLINICAL DATA:  Fall, right posterior rib pain  EXAM: RIGHT RIBS AND CHEST - 3+ VIEW  COMPARISON:  Chest radiographs dated 12/01/2013  FINDINGS: Chronic interstitial markings. No focal consolidation. No pleural effusion or pneumothorax.  Cardiomegaly.  Postsurgical changes related to prior CABG.  Mildly displaced right posterior 9th-11th rib fractures.  IMPRESSION: Mildly displaced right posterior 9th-11th rib fractures.   Electronically Signed   By: Charline Bills M.D.   On: 03/12/2014 21:24    Final diagnoses:  Fracture of ribs, three, closed, right, initial encounter   I personally performed the services described in this documentation, which was scribed in my presence. The recorded information has been reviewed and is accurate.    Corey Seamen, MD 03/12/14 2351

## 2014-10-02 HISTORY — PX: BLADDER SURGERY: SHX569

## 2014-12-07 ENCOUNTER — Emergency Department (HOSPITAL_BASED_OUTPATIENT_CLINIC_OR_DEPARTMENT_OTHER): Payer: Medicare Other

## 2014-12-07 ENCOUNTER — Encounter (HOSPITAL_BASED_OUTPATIENT_CLINIC_OR_DEPARTMENT_OTHER): Payer: Self-pay | Admitting: Emergency Medicine

## 2014-12-07 ENCOUNTER — Emergency Department (HOSPITAL_BASED_OUTPATIENT_CLINIC_OR_DEPARTMENT_OTHER)
Admission: EM | Admit: 2014-12-07 | Discharge: 2014-12-07 | Disposition: A | Discharge status: Home / Self Care | Payer: Medicare Other | Attending: Emergency Medicine | Admitting: Emergency Medicine

## 2014-12-07 DIAGNOSIS — Z792 Long term (current) use of antibiotics: Secondary | ICD-10-CM | POA: Diagnosis not present

## 2014-12-07 DIAGNOSIS — Z79899 Other long term (current) drug therapy: Secondary | ICD-10-CM | POA: Diagnosis not present

## 2014-12-07 DIAGNOSIS — Z7982 Long term (current) use of aspirin: Secondary | ICD-10-CM | POA: Diagnosis not present

## 2014-12-07 DIAGNOSIS — Y998 Other external cause status: Secondary | ICD-10-CM | POA: Insufficient documentation

## 2014-12-07 DIAGNOSIS — Y9389 Activity, other specified: Secondary | ICD-10-CM | POA: Insufficient documentation

## 2014-12-07 DIAGNOSIS — W182XXA Fall in (into) shower or empty bathtub, initial encounter: Secondary | ICD-10-CM | POA: Insufficient documentation

## 2014-12-07 DIAGNOSIS — R0781 Pleurodynia: Secondary | ICD-10-CM

## 2014-12-07 DIAGNOSIS — S40011A Contusion of right shoulder, initial encounter: Secondary | ICD-10-CM | POA: Insufficient documentation

## 2014-12-07 DIAGNOSIS — W19XXXA Unspecified fall, initial encounter: Secondary | ICD-10-CM

## 2014-12-07 DIAGNOSIS — I10 Essential (primary) hypertension: Secondary | ICD-10-CM | POA: Diagnosis not present

## 2014-12-07 DIAGNOSIS — Z8719 Personal history of other diseases of the digestive system: Secondary | ICD-10-CM | POA: Diagnosis not present

## 2014-12-07 DIAGNOSIS — Z87448 Personal history of other diseases of urinary system: Secondary | ICD-10-CM | POA: Diagnosis not present

## 2014-12-07 DIAGNOSIS — S299XXA Unspecified injury of thorax, initial encounter: Secondary | ICD-10-CM | POA: Insufficient documentation

## 2014-12-07 DIAGNOSIS — Y92002 Bathroom of unspecified non-institutional (private) residence single-family (private) house as the place of occurrence of the external cause: Secondary | ICD-10-CM | POA: Diagnosis not present

## 2014-12-07 DIAGNOSIS — S3991XA Unspecified injury of abdomen, initial encounter: Secondary | ICD-10-CM | POA: Insufficient documentation

## 2014-12-07 MED ORDER — HYDROCODONE-ACETAMINOPHEN 5-325 MG PO TABS
1.0000 | ORAL_TABLET | Freq: Once | ORAL | Status: AC
  Administered 2014-12-07: 1 via ORAL
  Filled 2014-12-07: qty 1

## 2014-12-07 MED ORDER — HYDROCODONE-ACETAMINOPHEN 5-325 MG PO TABS: 1.0000 | ORAL_TABLET | ORAL | Status: AC | PRN

## 2014-12-07 NOTE — ED Notes (Addendum)
81 yo c/oleft rib pain after fall in the bathroom tonight. Pt states "I feel while getting up from the toilet and landed against the bathtub and could not get up." Pain 5/10 Takes Daily Asprin

## 2014-12-07 NOTE — Discharge Instructions (Signed)
Return to the ED with any concerns including difficulty breathing, worsening pain not controlled by pain medications, vomiting, seizure activity, headache, decreased level of alertness/lethargy, or any other alarming symptoms  You should use your incentive spirometer 10 times every hour as previously directed

## 2014-12-07 NOTE — ED Provider Notes (Signed)
CSN: [id number]     Arrival date & time 12/07/14  2052 History   This chart was scribed for Jerelyn ScottMartha Linker, MD by Octavia HeirArianna Nassar, ED Scribe. This patient was seen in room MH01/MH01 and the patient's care was started at 10:13 PM.    Chief Complaint  Patient presents with  . Fall     The history is provided by the patient. No language interpreter was used.    HPI Comments: Corey Gallagher is a 81 y.o. male who presents to the Emergency Department complaining of a fall that occurred tonight. Pt has associated left sided flank pain and a bruise on his right shoulder blade. Pt notes he went to the bathroom and slipped and fell backwards in the bathtub. Pt denies LOC and head injury. Pt was able to pull himself up after the incident occurred. Pt has PMHx of broken ribs in the past. Pain is constant, worse with movement and palpation.  There are no other associated systemic symptoms, there are no other alleviating or modifying factors.  Pt has no known drug allergies.  Past Medical History  Diagnosis Date  . Hypertension   . GERD (gastroesophageal reflux disease)   . Heart disease   . Renal disorder    Past Surgical History  Procedure Laterality Date  . Cholecystectomy    . Coronary artery bypass graft    . Bladder surgery  10/2014   No family history on file. History  Substance Use Topics  . Smoking status: Never Smoker   . Smokeless tobacco: Not on file  . Alcohol Use: No    Review of Systems  Musculoskeletal: Positive for myalgias.  All other systems reviewed and are negative.     Allergies  Review of patient's allergies indicates no known allergies.  Home Medications   Prior to Admission medications   Medication Sig Start Date End Date Taking? Authorizing Provider  aspirin 81 MG chewable tablet Chew 81 mg by mouth daily.    Historical Provider, MD  carvedilol (COREG) 6.25 MG tablet Take 6.25 mg by mouth 2 (two) times daily with a meal.    Historical Provider, MD   ciprofloxacin (CIPRO) 500 MG tablet Take 1 tablet (500 mg total) by mouth every 12 (twelve) hours. 04/16/12   Nelva Nayobert Beaton, MD  ergocalciferol (VITAMIN D2) 50000 UNITS capsule Take 50,000 Units by mouth once a week.    Historical Provider, MD  furosemide (LASIX) 20 MG tablet Take 20 mg by mouth 2 (two) times daily.    Historical Provider, MD  HYDROcodone-acetaminophen (NORCO/VICODIN) 5-325 MG per tablet Take 1 tablet by mouth every 4 (four) hours as needed. 12/07/14   Jerelyn ScottMartha Linker, MD  IRON PO Take 1 tablet by mouth 2 (two) times daily.    Historical Provider, MD  nitrofurantoin, macrocrystal-monohydrate, (MACROBID) 100 MG capsule Take 1 capsule (100 mg total) by mouth 2 (two) times daily. 04/16/12   Nelva Nayobert Beaton, MD  simvastatin (ZOCOR) 10 MG tablet Take 10 mg by mouth daily.    Historical Provider, MD   Triage vitals: BP 129/79 mmHg  Pulse 68  Temp(Src) 98.2 F (36.8 C) (Oral)  Resp 18  Ht 5\' 4"  (1.626 m)  Wt 180 lb (81.647 kg)  BMI 30.88 kg/m2  SpO2 96% Vitals reviewed Physical Exam  Physical Examination: General appearance - alert, well appearing, and in no distress Mental status - alert, oriented to person, place, and time Head- NCAT Eyes - PERRL, EOMI Neck - supple, no significant adenopathy Chest - clear  to auscultation, no wheezes, rales or rhonchi, symmetric air entry, ttp over lateral left ribs at midaxillary line, no crepitus Heart - normal rate, regular rhythm, normal S1, S2, no murmurs, rubs, clicks or gallops Abdomen - soft, nontender, nondistended, no masses or organomegaly Back exam -no midline tenderness to palpation overlying c/t/l spine.  No CVA tenderness, some contusion overlying Neurological - alert, oriented x 3, no cranial nerve defect, strength 5/5 in extremities x 4, sensation intact Musculoskeletal - no joint tenderness, deformity or swelling Extremities - peripheral pulses normal, no pedal edema, no clubbing or cyanosis Skin - normal coloration and  turgor, no rashes  ED Course  Procedures  DIAGNOSTIC STUDIES: Oxygen Saturation is 96% on RA, adequate by my interpretation.  COORDINATION OF CARE:  10:17 PM Discussed treatment plan which includes minor pain medication with pt at bedside and pt agreed to plan.   Labs Review Labs Reviewed - No data to display  Imaging Review Dg Ribs Unilateral W/chest Left  12/07/2014   CLINICAL DATA:  LEFT rib pain after a fall in the bathroom.  EXAM: LEFT RIBS AND CHEST - 3+ VIEW  COMPARISON:  None.  FINDINGS: No acute fracture or other bone lesions are seen involving the LEFT ribs. There is no evidence of pneumothorax or pleural effusion. Both lungs are clear. The heart is enlarged. Prior CABG. Thoracic aorta atherosclerosis. Chronic rib fractures on the RIGHT.  IMPRESSION: Negative for acute LEFT rib fracture. Cardiomegaly without active infiltrates or failure.   Electronically Signed   By: Davonna Belling M.D.   On: 12/07/2014 21:38     EKG Interpretation None      MDM   Final diagnoses:  Fall, initial encounter  Rib pain on left side    Pt presenting after mechanical fall in bathroom at home.  He has pain in left lateral ribcage, xrays are reassuring.   Xray images reviewed and interpreted by me as well.  He has a contusion overlying right shoulder blade, but has no tenderness to palpation overlying this area.  Pt has incentive spirometer at home, instructed on its use.  Given shourt course of hydrocodone as well.   Discharged with strict return precautions.  Pt agreeable with plan.  I personally performed the services described in this documentation, which was scribed in my presence. The recorded information has been reviewed and is accurate.    Jerelyn Scott, MD 12/08/14 [phone]

## 2015-09-30 IMAGING — CR DG RIBS W/ CHEST 3+V*R*
4 series · 4 of 4 positions shown · non-contrast
Comparison: Chest radiographs dated 12/01/2013

CLINICAL DATA: Fall, right posterior rib pain

EXAM:
RIGHT RIBS AND CHEST - 3+ VIEW

[w chest pa]
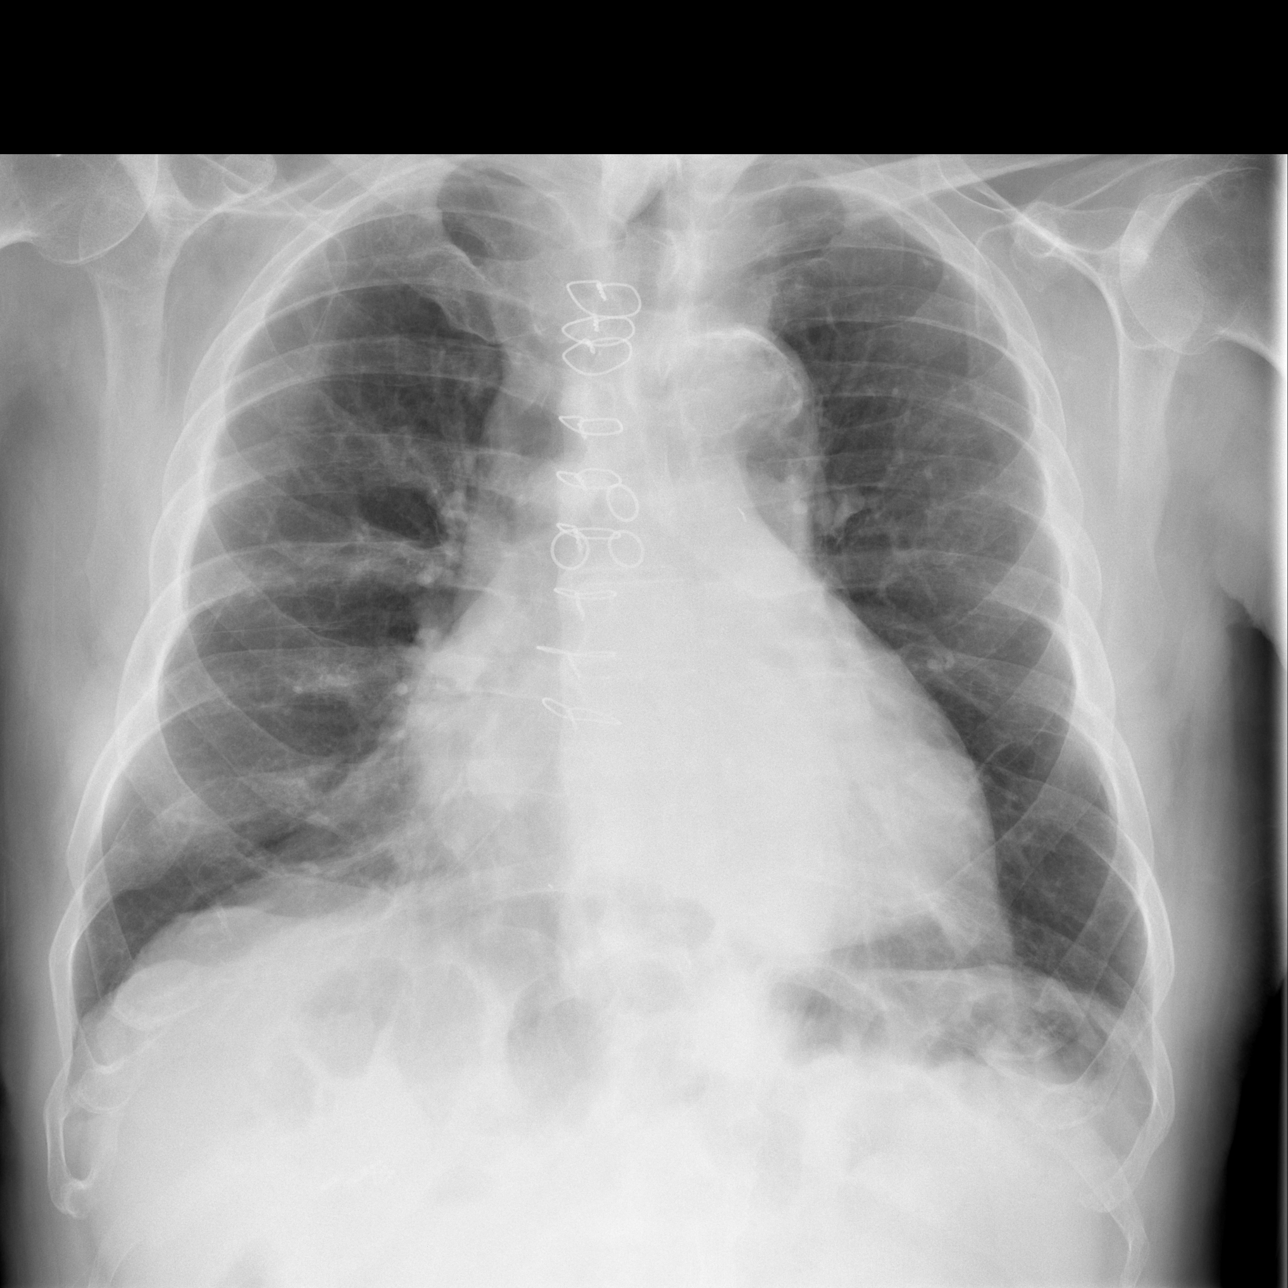

[w ribs ap/pa lower right]
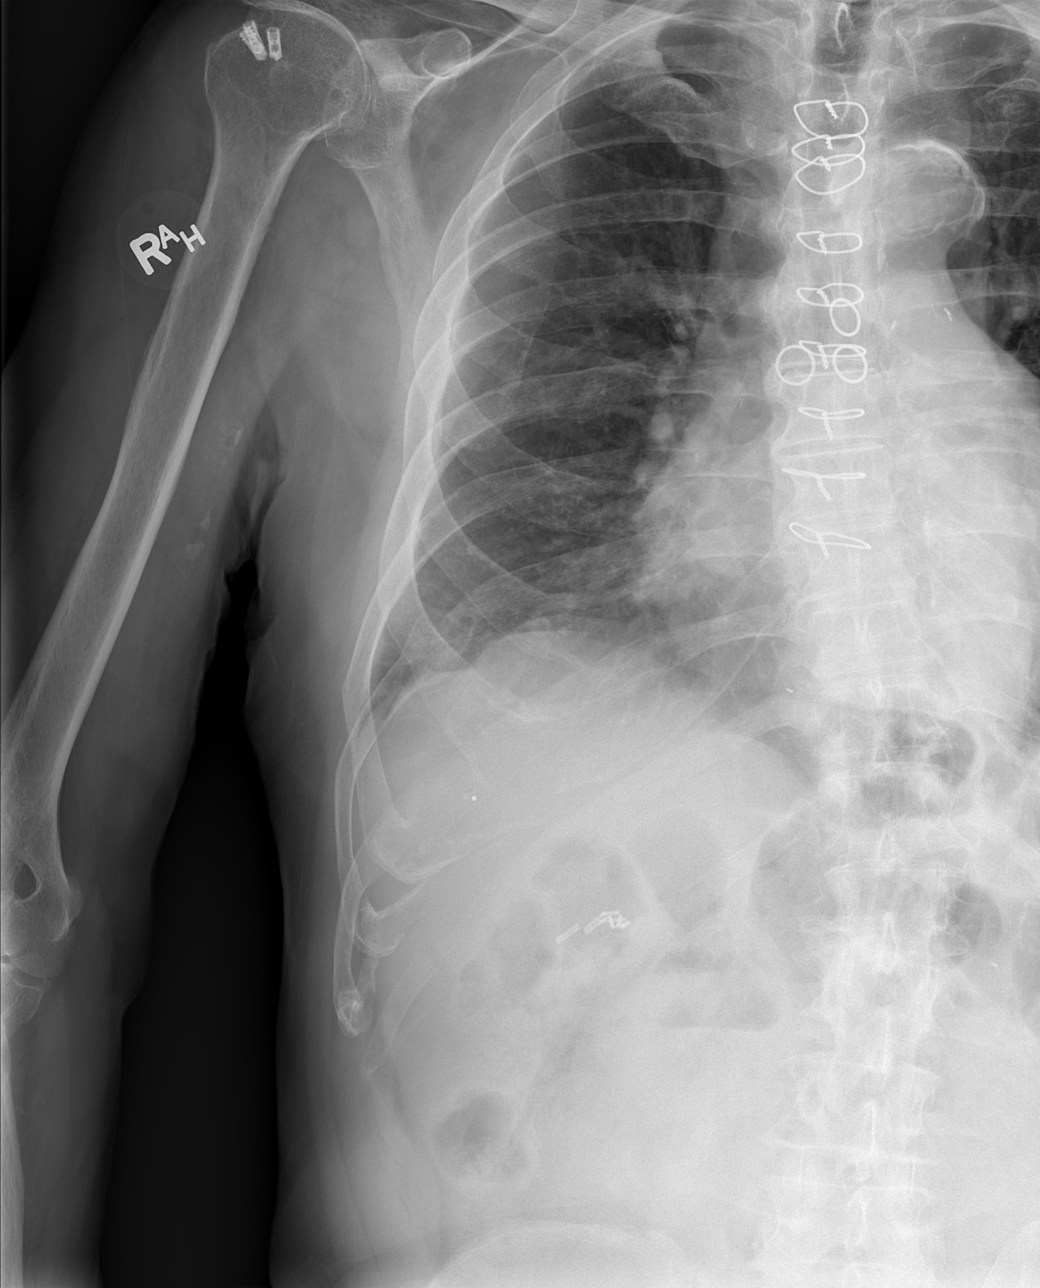

[w ribs oblique right (1 of 2)]
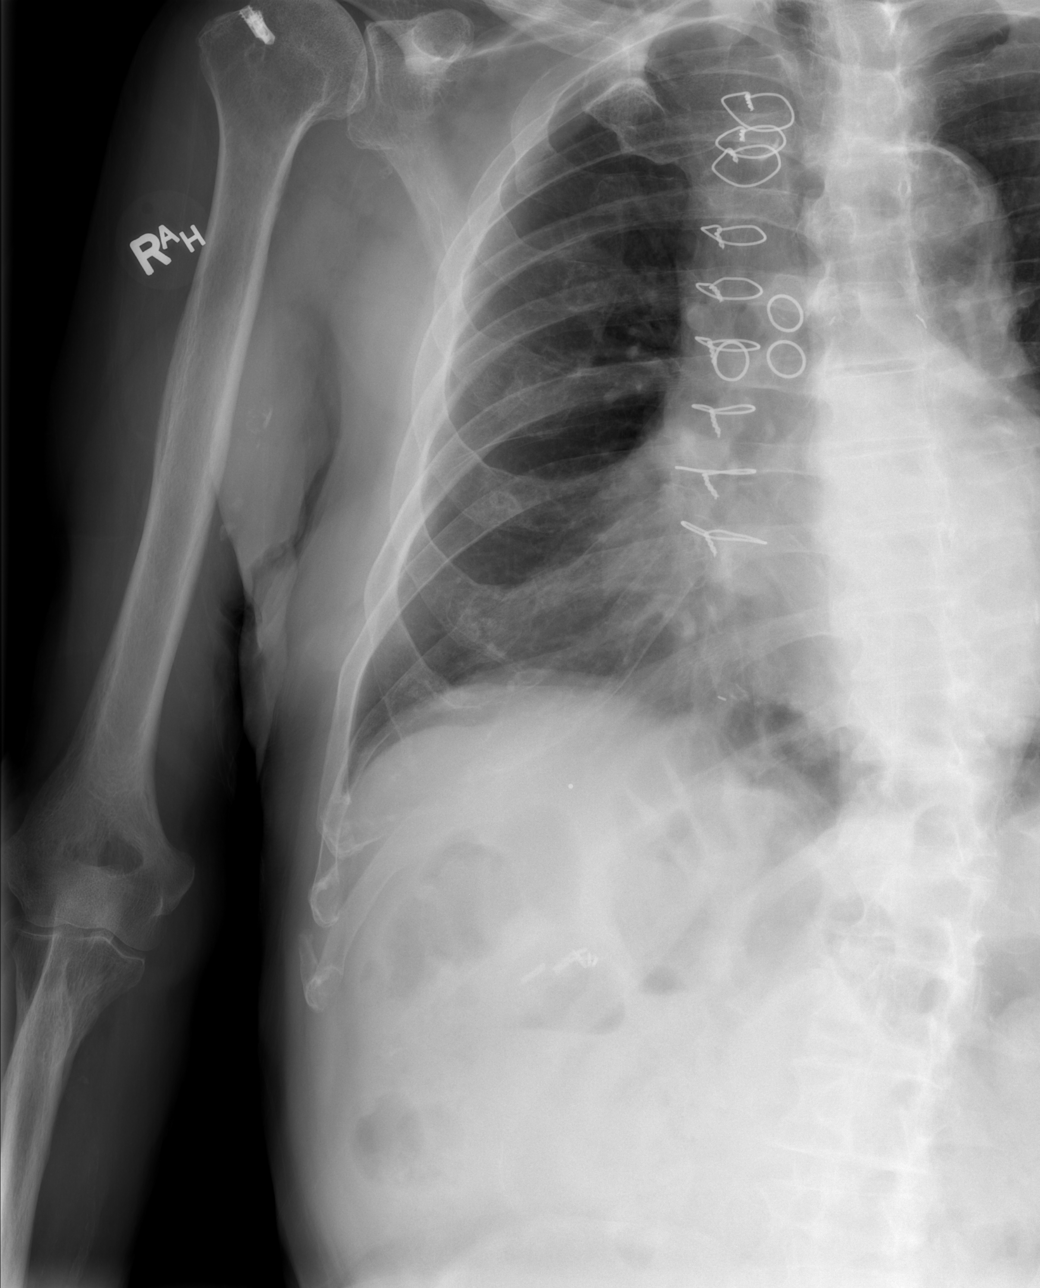

[w ribs oblique right (2 of 2)]
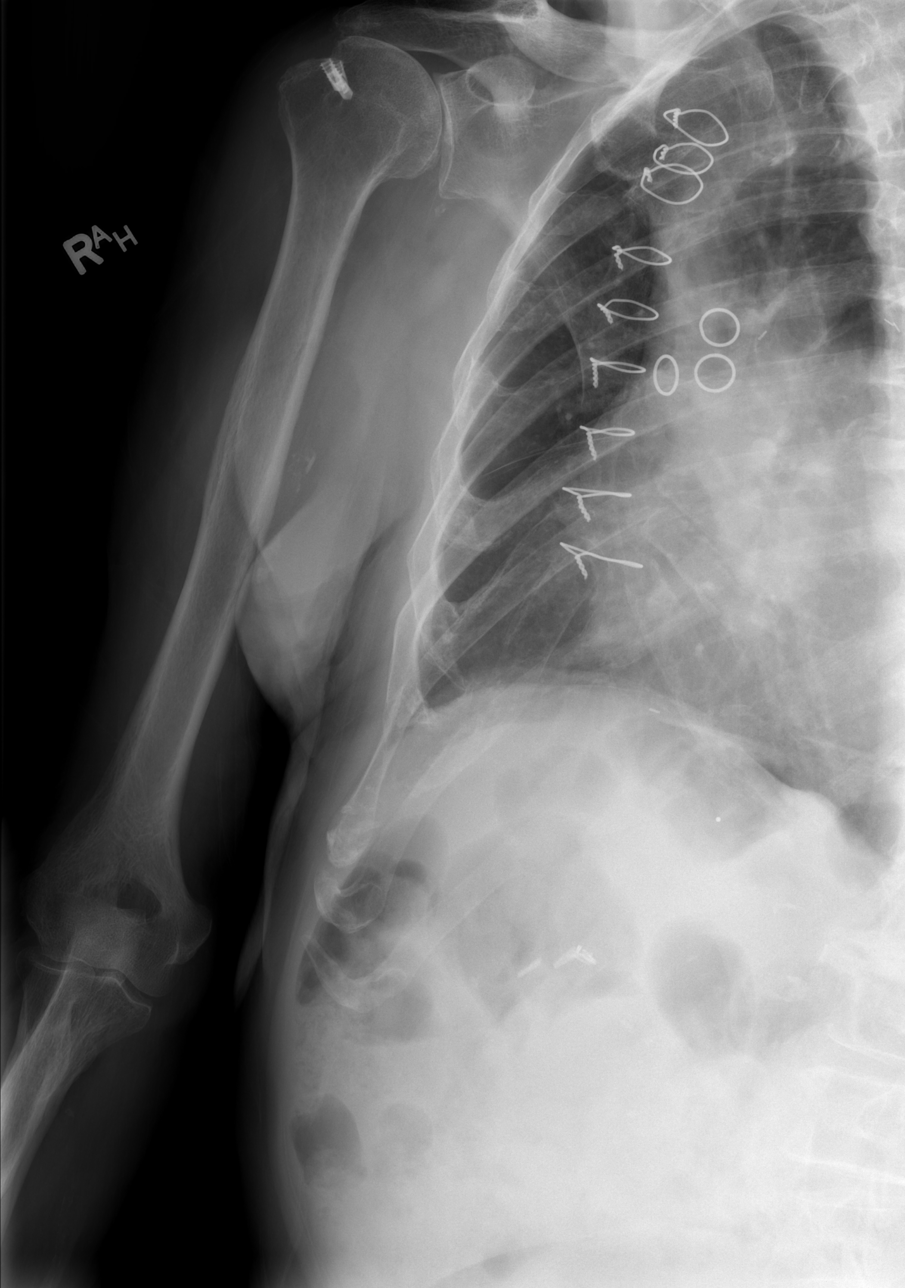

[4 of 4 positions shown; findings below may reference images not displayed]

FINDINGS: Chronic interstitial markings. No focal consolidation. No pleural
effusion or pneumothorax.

Cardiomegaly.  Postsurgical changes related to prior CABG.

Mildly displaced right posterior 4th-55th rib fractures.
IMPRESSION: Mildly displaced right posterior 4th-55th rib fractures.

## 2015-10-02 DEATH — deceased

## 2016-06-26 IMAGING — CR DG RIBS W/ CHEST 3+V*L*
3 series · 3 of 3 positions shown · non-contrast
Comparison: None.

CLINICAL DATA: LEFT rib pain after a fall in the bathroom.

EXAM:
LEFT RIBS AND CHEST - 3+ VIEW

[w chest pa]
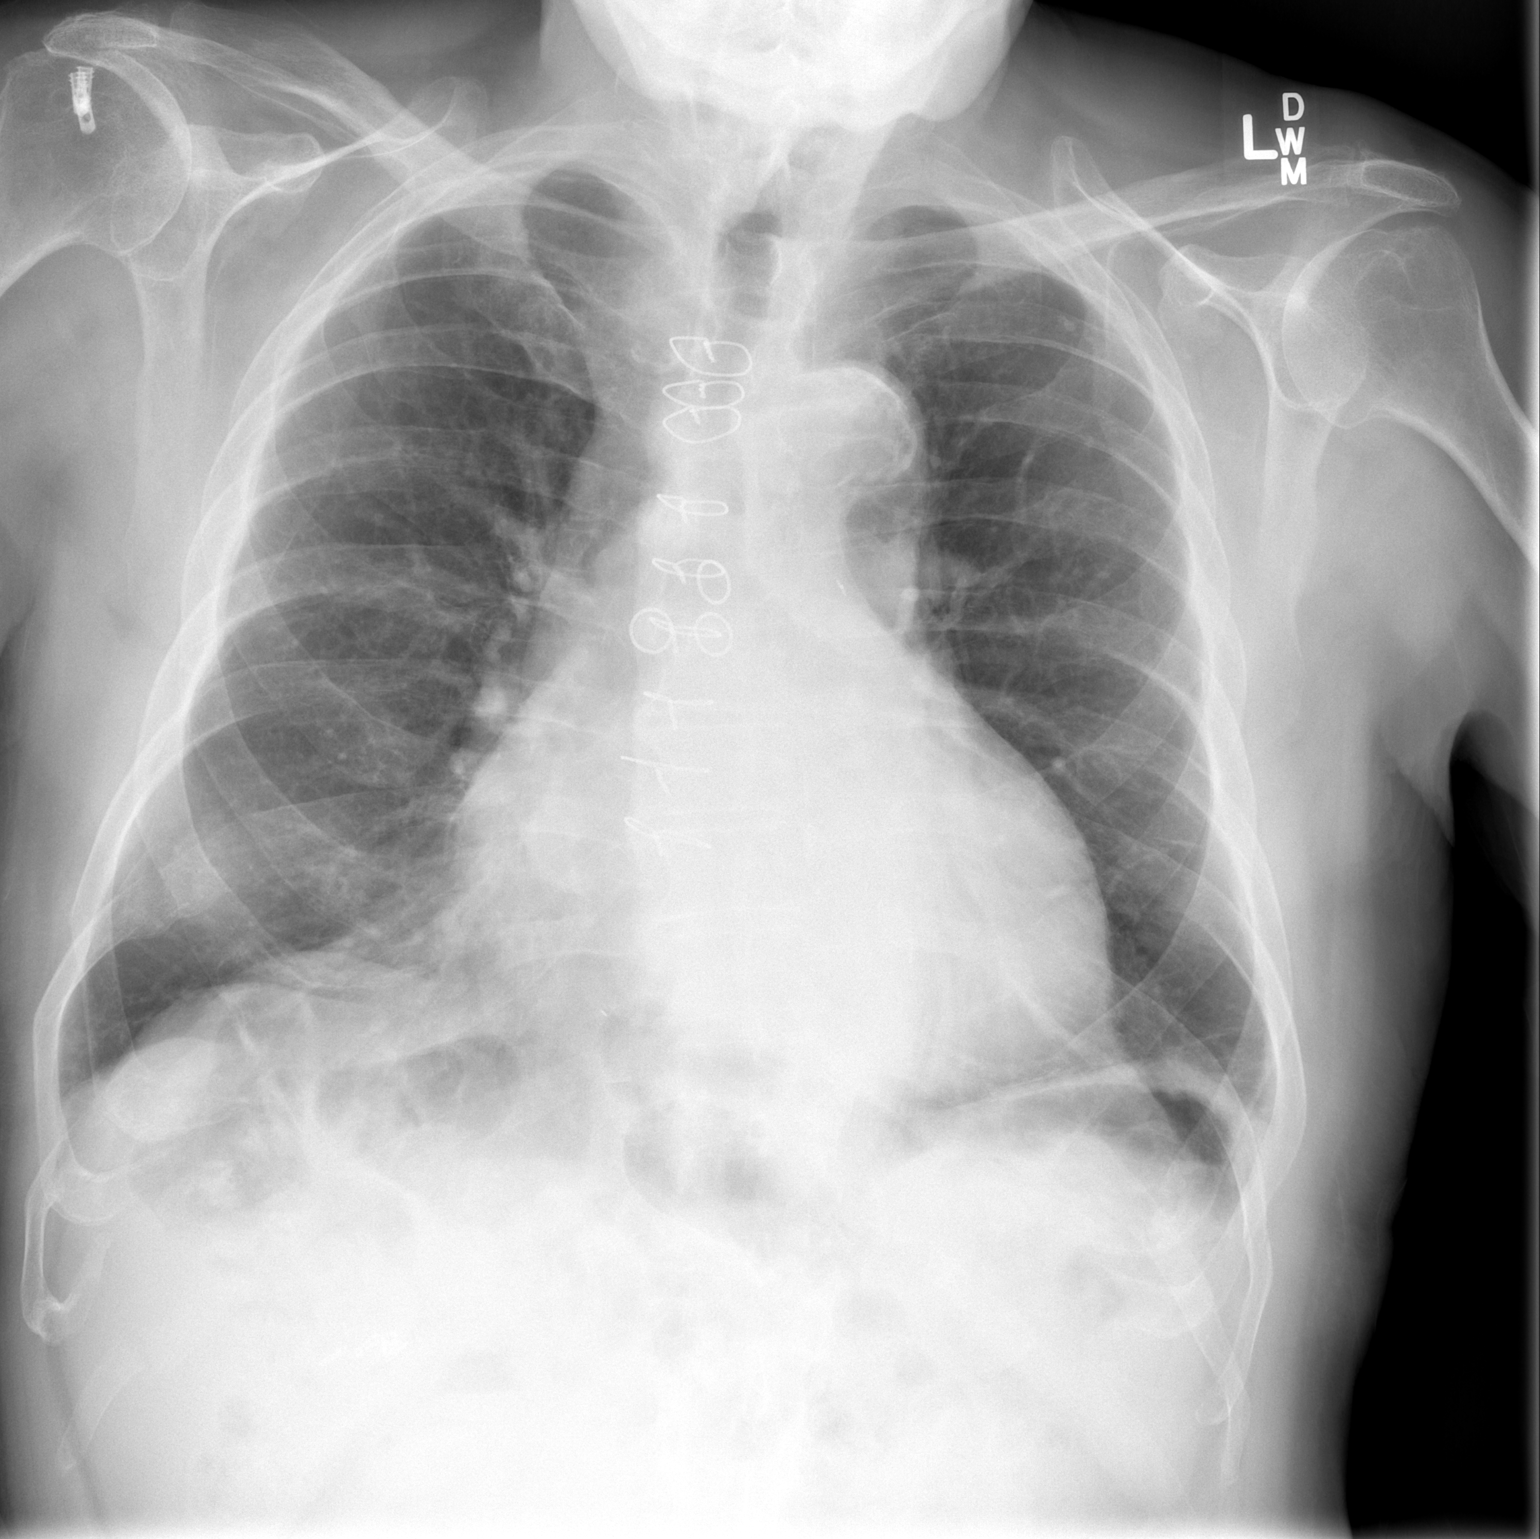

[w ribs ap/pa upper left]
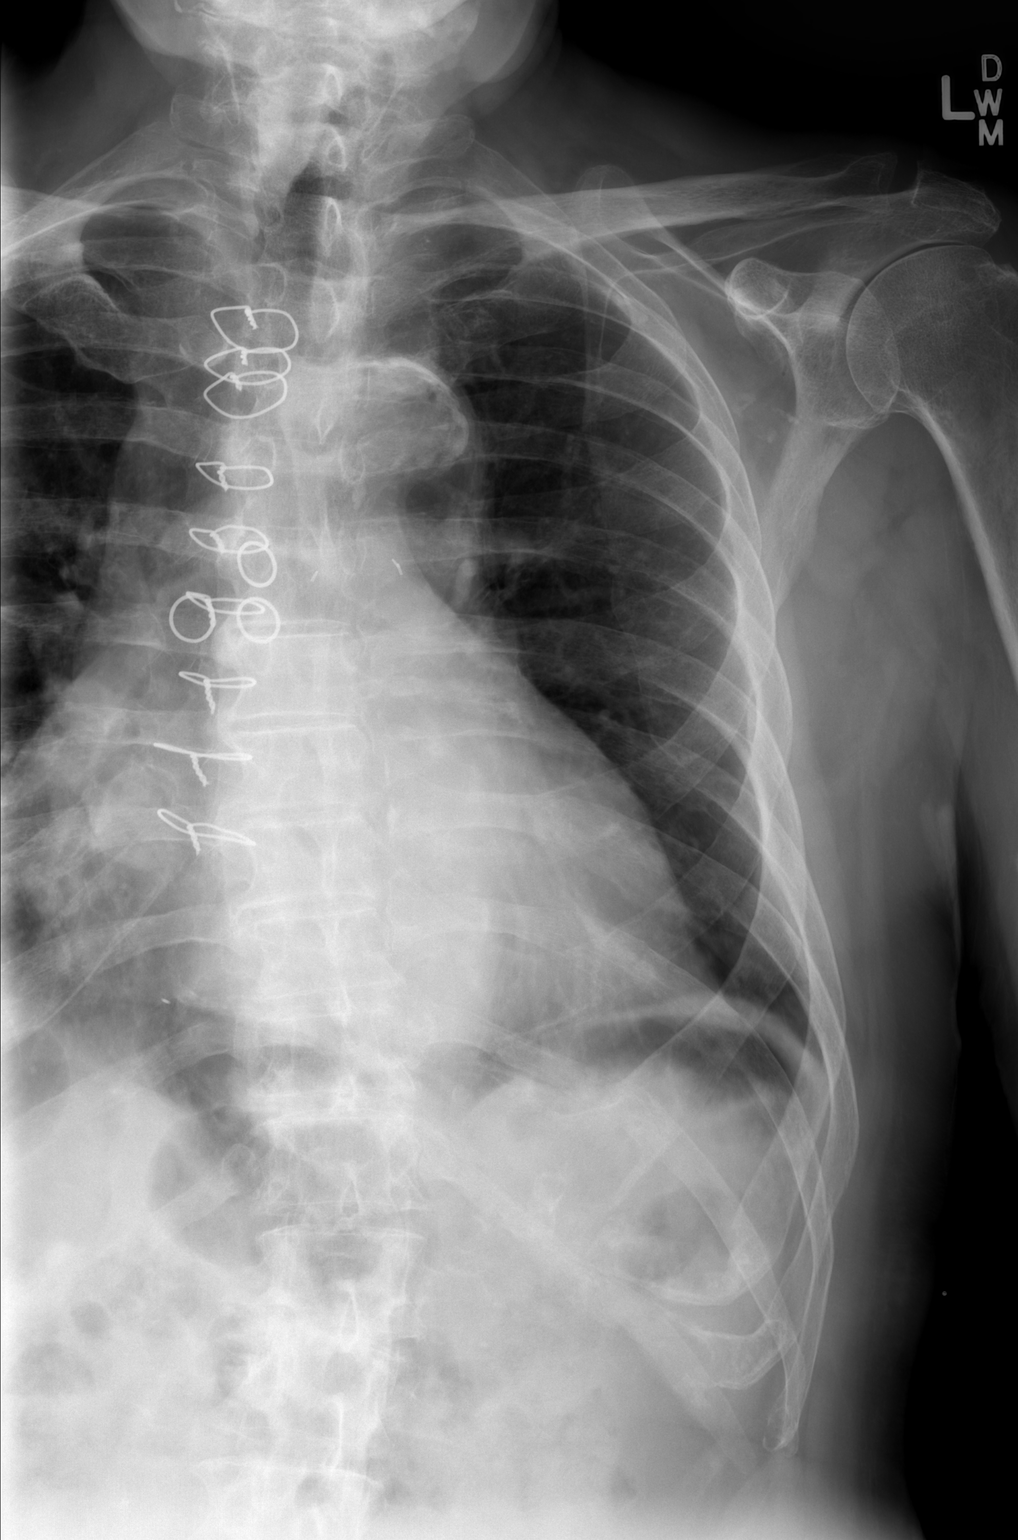

[w ribs oblique left]
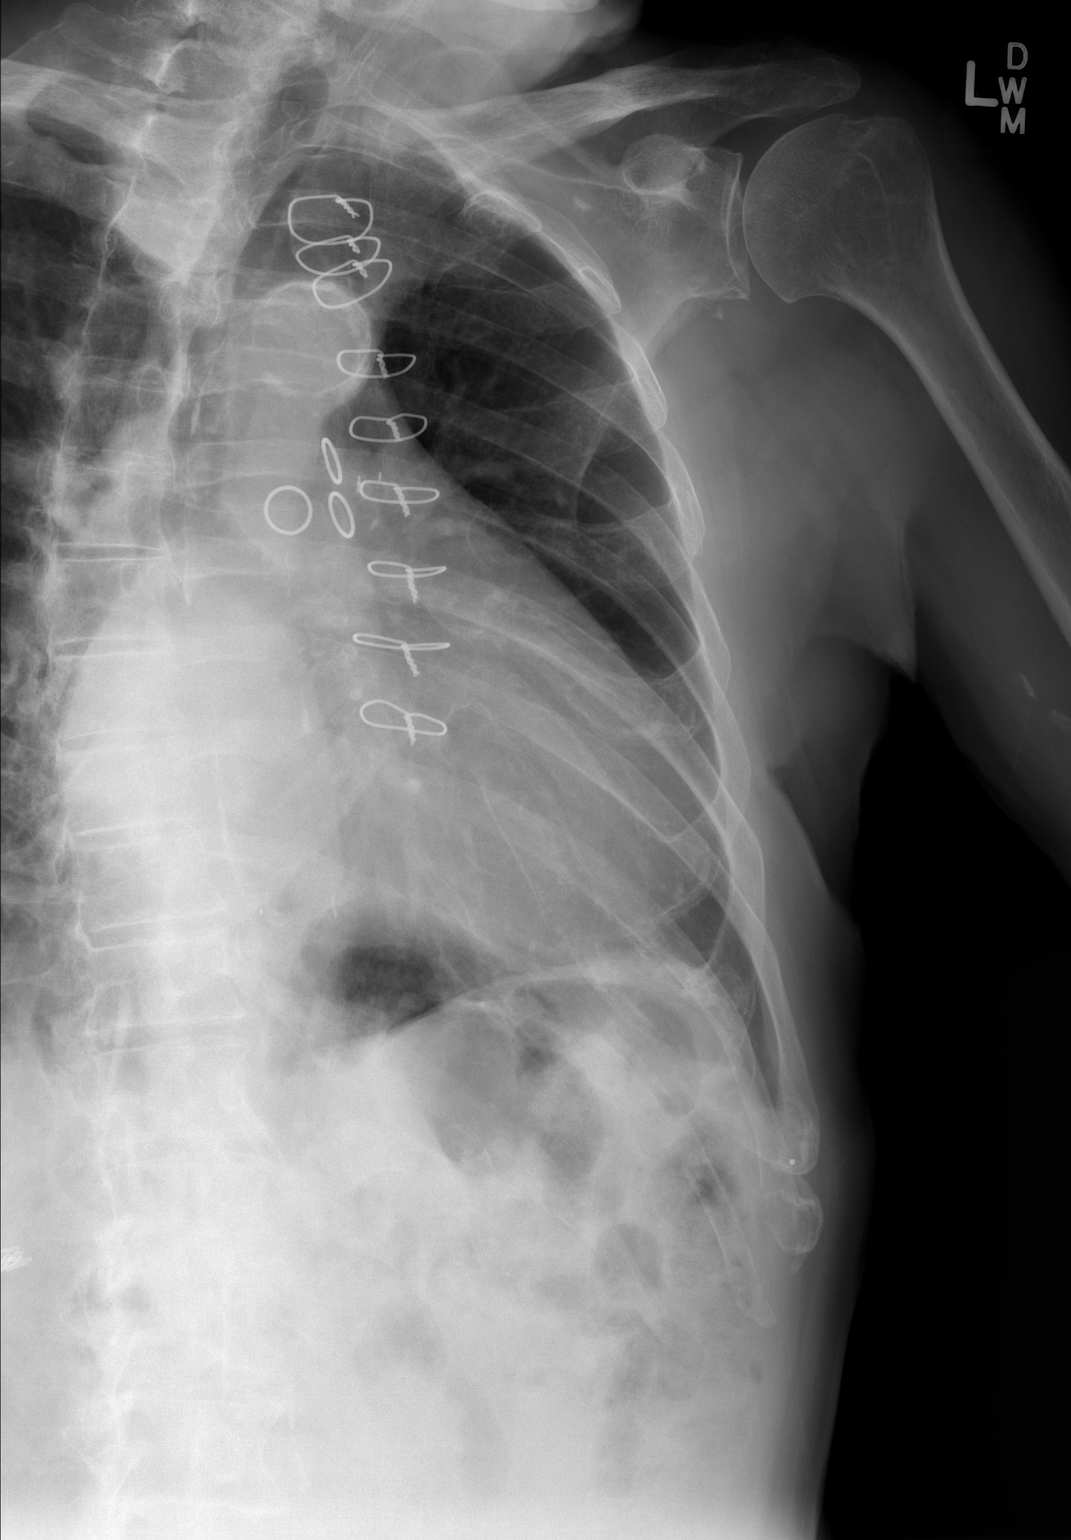

[3 of 3 positions shown; findings below may reference images not displayed]

FINDINGS: No acute fracture or other bone lesions are seen involving the LEFT
ribs. There is no evidence of pneumothorax or pleural effusion. Both
lungs are clear. The heart is enlarged. Prior CABG. Thoracic aorta
atherosclerosis. Chronic rib fractures on the RIGHT.
IMPRESSION: Negative for acute LEFT rib fracture. Cardiomegaly without active
infiltrates or failure.
